# Patient Record
Sex: Male | Born: 2000
Health system: Southern US, Community
[De-identification: ages and names within clinical notes are randomized; demographics above are authoritative.]

---
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
## 2019-01-20 ENCOUNTER — Other Ambulatory Visit: Payer: Self-pay

## 2019-01-20 DIAGNOSIS — Z20822 Contact with and (suspected) exposure to covid-19: Secondary | ICD-10-CM

## 2019-01-21 LAB — NOVEL CORONAVIRUS, NAA: SARS-CoV-2, NAA: NOT DETECTED

## 2020-02-29 ENCOUNTER — Other Ambulatory Visit: Payer: Self-pay

## 2020-02-29 DIAGNOSIS — Z20822 Contact with and (suspected) exposure to covid-19: Secondary | ICD-10-CM

## 2020-03-02 ENCOUNTER — Other Ambulatory Visit: Payer: Self-pay

## 2020-03-02 ENCOUNTER — Ambulatory Visit
Admission: RE | Admit: 2020-03-02 | Discharge: 2020-03-02 | Disposition: A | Discharge status: Home / Self Care | Payer: 59 | Source: Ambulatory Visit | Attending: Physician Assistant | Admitting: Physician Assistant

## 2020-03-02 VITALS — BP 112/80 | HR 100 | Temp 98.6°F | Resp 20

## 2020-03-02 DIAGNOSIS — R1013 Epigastric pain: Secondary | ICD-10-CM

## 2020-03-02 DIAGNOSIS — R112 Nausea with vomiting, unspecified: Secondary | ICD-10-CM | POA: Diagnosis not present

## 2020-03-02 LAB — NOVEL CORONAVIRUS, NAA: SARS-CoV-2, NAA: NOT DETECTED

## 2020-03-02 LAB — SARS-COV-2, NAA 2 DAY TAT

## 2020-03-02 MED ORDER — ONDANSETRON 4 MG PO TBDP: 4.0000 mg | ORAL_TABLET | Freq: Three times a day (TID) | ORAL | 0 refills | Status: DC | PRN

## 2020-03-02 MED ORDER — OMEPRAZOLE 20 MG PO CPDR: 20.0000 mg | DELAYED_RELEASE_CAPSULE | Freq: Every day | ORAL | 0 refills | Status: AC

## 2020-03-02 NOTE — ED Provider Notes (Signed)
[name]    CSN: [id number] Arrival date & time: 03/02/20  1602      History   Chief Complaint No chief complaint on file.   HPI George Chambers is a 19 y.o. male.   19 year old male comes in for 3 day history of nausea/vomiting, abdominal pain. Has had 5+ episodes of NBNB vomiting, worse in the mornings. Has tolerated some fluid intake since last episode of vomiting.  Epigastric pain that is constant, aching, worse with vomiting or food intake.  Denies diarrhea.  T-max 99 that has since resolved.  Has had some sore throat, throat clearing that is thought to be more associated with vomiting.  Denies rhinorrhea, nasal congestion.  Occasional THC use.  Daily vapor.  Denies any new medication.  Denies chronic NSAID use.  Denies alcohol use.  Denies other drug use.     History reviewed. No pertinent past medical history.  Patient Active Problem List   Diagnosis Date Noted  . Cannabis use disorder, mild, abuse 02/20/2018  . Moderate episode of recurrent major depressive disorder (HCC) 02/20/2018  . Panic disorder 02/20/2018    History reviewed. No pertinent surgical history.     Home Medications    Prior to Admission medications   Medication Sig Start Date End Date Taking? Authorizing Provider  escitalopram (LEXAPRO) 10 MG tablet 15 mg.  06/03/18   [provider]  omeprazole (PRILOSEC) 20 MG capsule Take 1 capsule (20 mg total) by mouth daily. 03/02/20   Cathie Hoops, Paolo Okane V, PA-C  ondansetron (ZOFRAN ODT) 4 MG disintegrating tablet Take 1 tablet (4 mg total) by mouth every 8 (eight) hours as needed for nausea or vomiting. 03/02/20   Belinda Fisher, PA-C    Family History History reviewed. No pertinent family history.  Social History Social History   Tobacco Use  . Smoking status: Not on file  Substance Use Topics  . Alcohol use: Not on file  . Drug use: Not on file     Allergies   Patient has no known allergies.   Review of Systems Review of Systems   Reason unable to perform ROS: See HPI as above.     Physical Exam Triage Vital Signs ED Triage Vitals [03/02/20 1606]  Enc Vitals Group     BP 112/80     Pulse Rate 100     Resp 20     Temp 98.6 F (37 C)     Temp Source Oral     SpO2 98 %     Weight      Height      Head Circumference      Peak Flow      Pain Score      Pain Loc      Pain Edu?      Excl. in GC?    No data found.  Updated Vital Signs BP 112/80 (BP Location: Left Arm)   Pulse 100   Temp 98.6 F (37 C) (Oral)   Resp 20   SpO2 98%   Physical Exam Constitutional:      General: He is not in acute distress.    Appearance: Normal appearance. He is not ill-appearing, toxic-appearing or diaphoretic.  HENT:     Head: Normocephalic and atraumatic.  Cardiovascular:     Rate and Rhythm: Normal rate and regular rhythm.  Pulmonary:     Effort: Pulmonary effort is normal. No respiratory distress.     Comments: LCTAB Abdominal:  General: Bowel sounds are normal.     Palpations: Abdomen is soft.     Tenderness: There is abdominal tenderness in the epigastric area. There is no right CVA tenderness, left CVA tenderness, guarding or rebound. Negative signs include Murphy's sign.  Musculoskeletal:     Cervical back: Normal range of motion and neck supple.  Skin:    General: Skin is warm and dry.  Neurological:     Mental Status: He is alert and oriented to person, place, and time.      UC Treatments / Results  Labs (all labs ordered are listed, but only abnormal results are displayed) Labs Reviewed - No data to display  EKG   Radiology No results found.  Procedures Procedures (including critical care time)  Medications Ordered in UC Medications - No data to display  Initial Impression / Assessment and Plan / UC Course  I have reviewed the triage vital signs and the nursing notes.  Pertinent labs & imaging results that were available during my care of the patient were reviewed by me and  considered in my medical decision making (see chart for details).    Discussed with patient no alarming signs on exam. Zofran for nausea. Push fluids. Bland diet, advance as tolerated. Return precautions given.  Final Clinical Impressions(s) / UC Diagnoses   Final diagnoses:  Intractable vomiting with nausea, unspecified vomiting type  Epigastric pain   ED Prescriptions    Medication Sig Dispense Auth. Provider   ondansetron (ZOFRAN ODT) 4 MG disintegrating tablet Take 1 tablet (4 mg total) by mouth every 8 (eight) hours as needed for nausea or vomiting. 20 tablet Zsofia Prout V, PA-C   omeprazole (PRILOSEC) 20 MG capsule Take 1 capsule (20 mg total) by mouth daily. 14 capsule Belinda Fisher, PA-C     PDMP not reviewed this encounter.   Belinda Fisher, PA-C 03/02/20 1629

## 2020-03-02 NOTE — Discharge Instructions (Signed)
Zofran for nausea and vomiting as needed. Omeprazole as directed. Keep hydrated, you urine should be clear to pale yellow in color. Bland diet, advance as tolerated. Avoid smoking, ibuprofen medicine until symptoms improve. Monitor for any worsening of symptoms, nausea or vomiting not controlled by medication, worsening abdominal pain, fever, go to the emergency department for further evaluation needed.

## 2020-03-02 NOTE — ED Triage Notes (Signed)
Pt states nausea and vomiting with some abdominal pain. Pt is aox4 and ambulatory.

## 2020-03-08 ENCOUNTER — Other Ambulatory Visit: Payer: Self-pay

## 2020-03-08 ENCOUNTER — Ambulatory Visit (HOSPITAL_COMMUNITY)
Admission: RE | Admit: 2020-03-08 | Discharge: 2020-03-08 | Disposition: A | Discharge status: Home / Self Care | Payer: 59 | Source: Ambulatory Visit | Attending: Urgent Care | Admitting: Urgent Care

## 2020-03-08 ENCOUNTER — Encounter (HOSPITAL_COMMUNITY): Payer: Self-pay

## 2020-03-08 VITALS — BP 116/68 | HR 76 | Temp 98.2°F | Resp 17

## 2020-03-08 DIAGNOSIS — F1290 Cannabis use, unspecified, uncomplicated: Secondary | ICD-10-CM

## 2020-03-08 DIAGNOSIS — R112 Nausea with vomiting, unspecified: Secondary | ICD-10-CM

## 2020-03-08 MED ORDER — ONDANSETRON 4 MG PO TBDP: 4.0000 mg | ORAL_TABLET | Freq: Three times a day (TID) | ORAL | 0 refills | Status: AC | PRN

## 2020-03-08 NOTE — Discharge Instructions (Signed)
Make sure you push fluids drinking mostly water but mix it with Gatorade.  Try to eat light meals including soups, broths and soft foods, fruits.  You may use Zofran for your nausea and vomiting once every 8 hours.   Please return to the clinic if symptoms worsen or you start having severe abdominal pain not helped by taking Tylenol or start having bloody stools or blood in the vomit.  

## 2020-03-08 NOTE — ED Provider Notes (Signed)
[facility]   MRN: [id number] DOB: [birth year]  Subjective:   George Chambers is a 19 y.o. male presenting for 8-day history of persistent nausea with vomiting. Patient was smoking marijuana twice daily up until then. Has used Delta 8 more recently. Denies fever, chest pain, shob, hematemesis, bloody stools, hx of GI conditions.   No current facility-administered medications for this encounter.  Current Outpatient Medications:    escitalopram (LEXAPRO) 10 MG tablet, 15 mg. , Disp: , Rfl:    omeprazole (PRILOSEC) 20 MG capsule, Take 1 capsule (20 mg total) by mouth daily., Disp: 14 capsule, Rfl: 0   ondansetron (ZOFRAN ODT) 4 MG disintegrating tablet, Take 1 tablet (4 mg total) by mouth every 8 (eight) hours as needed for nausea or vomiting., Disp: 20 tablet, Rfl: 0   No Known Allergies  History reviewed. No pertinent past medical history.   History reviewed. No pertinent surgical history.  History reviewed. No pertinent family history.  Social History   Tobacco Use   Smoking status: Never Smoker   Smokeless tobacco: Never Used  Vaping Use   Vaping Use: Never used  Substance Use Topics   Alcohol use: Yes   Drug use: Never    ROS   Objective:   Vitals: BP 116/68 (BP Location: Left Arm)    Pulse 76    Temp 98.2 F (36.8 C) (Oral)    Resp 17    SpO2 98%   Physical Exam Constitutional:      General: He is not in acute distress.    Appearance: Normal appearance. He is well-developed. He is not ill-appearing, toxic-appearing or diaphoretic.  HENT:     Head: Normocephalic and atraumatic.     Right Ear: External ear normal.     Left Ear: External ear normal.     Nose: Nose normal.     Mouth/Throat:     Mouth: Mucous membranes are moist.     Pharynx: Oropharynx is clear.  Eyes:     General: No scleral icterus.       Right eye: No discharge.        Left eye: No discharge.     Extraocular Movements: Extraocular movements intact.      Conjunctiva/sclera: Conjunctivae normal.     Pupils: Pupils are equal, round, and reactive to light.  Cardiovascular:     Rate and Rhythm: Normal rate and regular rhythm.     Heart sounds: Normal heart sounds. No murmur heard.  No friction rub. No gallop.   Pulmonary:     Effort: Pulmonary effort is normal. No respiratory distress.     Breath sounds: Normal breath sounds. No stridor. No wheezing, rhonchi or rales.  Abdominal:     General: Bowel sounds are normal. There is no distension.     Palpations: Abdomen is soft. There is no mass.     Tenderness: There is no abdominal tenderness. There is no guarding or rebound.  Skin:    General: Skin is warm and dry.  Neurological:     Mental Status: He is alert and oriented to person, place, and time.  Psychiatric:        Mood and Affect: Mood normal.        Behavior: Behavior normal.        Thought Content: Thought content normal.        Judgment: Judgment normal.      Assessment and Plan :   PDMP not reviewed this encounter.  1.  Nausea and vomiting, intractability of vomiting not specified, unspecified vomiting type   2. Marijuana use     Recommended supportive care, Zofran for nausea and vomiting.  Had a discussion about stopping all marijuana products.  Provided with referral to [facility] gastroenterology should his symptoms persist. Counseled patient on potential for adverse effects with medications prescribed/recommended today, ER and return-to-clinic precautions discussed, patient verbalized understanding.'   Wallis Bamberg, PA-C 03/08/20 1359

## 2020-03-08 NOTE — ED Triage Notes (Signed)
Pt presents with nausea and vomiting xs 1-2 weeks. Was seen on 11/10 and tested negative for COVID. States the zofran given does help with nausea.

## 2020-03-10 ENCOUNTER — Other Ambulatory Visit: Payer: Self-pay

## 2020-03-10 ENCOUNTER — Ambulatory Visit
Admission: RE | Admit: 2020-03-10 | Discharge: 2020-03-10 | Disposition: A | Discharge status: Home / Self Care | Payer: 59 | Source: Ambulatory Visit

## 2020-03-10 VITALS — BP 128/92 | HR 97 | Temp 98.7°F | Resp 20

## 2020-03-10 DIAGNOSIS — R112 Nausea with vomiting, unspecified: Secondary | ICD-10-CM | POA: Diagnosis not present

## 2020-03-10 DIAGNOSIS — R1013 Epigastric pain: Secondary | ICD-10-CM

## 2020-03-10 NOTE — Discharge Instructions (Signed)
Restart omeprazole and do at least a 1 week course to see if this helps with the abdominal pain. Zofran as needed for nausea/vomiting. Follow up with GI for  further evaluation and management needed.

## 2020-03-10 NOTE — ED Triage Notes (Signed)
Pt states has been vomiting x1.5wks. states dx'd with cannabinoid hyperemesis syndrome and wants a second opinion d/t the sx's are different then what he has. Pt states given zofran with relief. States hasn't vomit in 2 days. States developed a cough yesterday, fatigue, and sweaty palms.

## 2020-03-10 NOTE — ED Provider Notes (Signed)
[name]    CSN: [id number] Arrival date & time: 03/10/20  1557      History   Chief Complaint Chief Complaint  Patient presents with  . appt 4- vomiting    HPI George Chambers is a 19 y.o. male.   19 year old male comes in for further evaluation of 1.5 week history of vomiting. He was first seen 03/02/2020 for similar symptoms, provided zofran and omeprazole. He did not start PPI course. zofran with good relief of nausea/vomiting. He was seen 2 days ago for continued symptoms and told symptoms was likely due to [facility] use. Patient states would like second opinion. He has not vomited the past 2 days. Still with epigastric pain that is worse with vomiting, shortly after oral intake. Noted to have some cough today without fever, shob.      History reviewed. No pertinent past medical history.  Patient Active Problem List   Diagnosis Date Noted  . Cannabis use disorder, mild, abuse 02/20/2018  . Moderate episode of recurrent major depressive disorder (HCC) 02/20/2018  . Panic disorder 02/20/2018    History reviewed. No pertinent surgical history.     Home Medications    Prior to Admission medications   Medication Sig Start Date End Date Taking? Authorizing Provider  escitalopram (LEXAPRO) 10 MG tablet 15 mg.  06/03/18   [provider]  omeprazole (PRILOSEC) 20 MG capsule Take 1 capsule (20 mg total) by mouth daily. 03/02/20   Cathie Hoops, Jamee Pacholski V, PA-C  ondansetron (ZOFRAN ODT) 4 MG disintegrating tablet Take 1 tablet (4 mg total) by mouth every 8 (eight) hours as needed for nausea or vomiting. 03/08/20   Wallis Bamberg, PA-C    Family History History reviewed. No pertinent family history.  Social History Social History   Tobacco Use  . Smoking status: Never Smoker  . Smokeless tobacco: Never Used  Vaping Use  . Vaping Use: Never used  Substance Use Topics  . Alcohol use: Yes  . Drug use: Never     Allergies   Patient has no known  allergies.   Review of Systems Review of Systems  Reason unable to perform ROS: See HPI as above.     Physical Exam Triage Vital Signs ED Triage Vitals  Enc Vitals Group     BP 03/10/20 1612 (!) 128/92     Pulse Rate 03/10/20 1612 97     Resp 03/10/20 1612 20     Temp 03/10/20 1612 98.7 F (37.1 C)     Temp Source 03/10/20 1612 Oral     SpO2 03/10/20 1612 98 %     Weight --      Height --      Head Circumference --      Peak Flow --      Pain Score 03/10/20 1629 0     Pain Loc --      Pain Edu? --      Excl. in GC? --    No data found.  Updated Vital Signs BP (!) 128/92 (BP Location: Left Arm)   Pulse 97   Temp 98.7 F (37.1 C) (Oral)   Resp 20   SpO2 98%   Physical Exam Constitutional:      General: He is not in acute distress.    Appearance: Normal appearance. He is not ill-appearing, toxic-appearing or diaphoretic.  HENT:     Head: Normocephalic and atraumatic.  Cardiovascular:     Rate and Rhythm: Normal rate and regular  rhythm.  Pulmonary:     Effort: Pulmonary effort is normal. No respiratory distress.     Comments: LCTAB Abdominal:     General: Bowel sounds are normal.     Palpations: Abdomen is soft.     Tenderness: There is abdominal tenderness in the epigastric area. There is no right CVA tenderness, left CVA tenderness, guarding or rebound.  Musculoskeletal:     Cervical back: Normal range of motion and neck supple.  Skin:    General: Skin is warm and dry.  Neurological:     Mental Status: He is alert and oriented to person, place, and time.      UC Treatments / Results  Labs (all labs ordered are listed, but only abnormal results are displayed) Labs Reviewed - No data to display  EKG   Radiology No results found.  Procedures Procedures (including critical care time)  Medications Ordered in UC Medications - No data to display  Initial Impression / Assessment and Plan / UC Course  I have reviewed the triage vital signs and  the nursing notes.  Pertinent labs & imaging results that were available during my care of the patient were reviewed by me and considered in my medical decision making (see chart for details).    Patient declined COVID testing. Discussed trial PPI course. Continue zofran as needed. Given 1.5 week history of symptoms, can follow up with GI for further evaluation if symptoms continue. Return precautions given.  Final Clinical Impressions(s) / UC Diagnoses   Final diagnoses:  Intractable vomiting with nausea, unspecified vomiting type  Epigastric pain   ED Prescriptions    None     PDMP not reviewed this encounter.   Belinda Fisher, PA-C 03/10/20 1659

## 2020-05-03 ENCOUNTER — Ambulatory Visit: Payer: Self-pay | Admitting: Registered Nurse
